# Patient Record
Sex: Female | Born: 1950 | Race: White | Hispanic: No | Marital: Married
Health system: Southern US, Community
[De-identification: ages and names within clinical notes are randomized; demographics above are authoritative.]

---

## 2000-08-21 ENCOUNTER — Encounter: Admission: RE | Admit: 2000-08-21 | Discharge: 2000-08-21 | Payer: Self-pay | Admitting: Sports Medicine

## 2000-12-17 ENCOUNTER — Encounter: Payer: Self-pay | Admitting: Emergency Medicine

## 2000-12-17 ENCOUNTER — Emergency Department (HOSPITAL_COMMUNITY): Admission: EM | Admit: 2000-12-17 | Discharge: 2000-12-17 | Payer: Self-pay | Admitting: Emergency Medicine

## 2001-01-03 ENCOUNTER — Ambulatory Visit (HOSPITAL_COMMUNITY): Admission: RE | Admit: 2001-01-03 | Discharge: 2001-01-03 | Payer: Self-pay | Admitting: Neurosurgery

## 2002-03-01 ENCOUNTER — Other Ambulatory Visit: Admission: RE | Admit: 2002-03-01 | Discharge: 2002-03-01 | Payer: Self-pay | Admitting: Family Medicine

## 2003-05-19 ENCOUNTER — Encounter: Admission: RE | Admit: 2003-05-19 | Discharge: 2003-05-19 | Payer: Self-pay | Admitting: Family Medicine

## 2003-05-29 ENCOUNTER — Encounter: Admission: RE | Admit: 2003-05-29 | Discharge: 2003-05-29 | Payer: Self-pay | Admitting: Family Medicine

## 2003-10-22 ENCOUNTER — Other Ambulatory Visit: Admission: RE | Admit: 2003-10-22 | Discharge: 2003-10-22 | Payer: Self-pay | Admitting: Family Medicine

## 2005-09-29 IMAGING — US US ABDOMEN COMPLETE
1 series · 13 of 25 positions shown · non-contrast
Comparison: none

CLINICAL DATA: Lower chest pain.  
 COMPLETE ABDOMINAL ULTRASOUND
 The gallbladder is well-visualized.  There is a tiny filling defect in the gallbladder with no shadowing and it does not move and is felt to represent a tiny gallbladder polyp.  The gallbladder otherwise appears normal.  The liver parenchyma, pancreas, and spleen are normal.  The common bile duct is normal at 2.9 mm in diameter.  The right kidney is 11.7 cm in length and has a simple 1.2 cm cyst in the mid portion.  The left kidney is 12.4 cm in length and has a simple cyst laterally in the kidney measuring 5 mm in size.  There is a complex 1.4 cm cyst with echogenic focus in the lower pole of the left kidney.  There is some shadowing from this echogenic focus suggestive of calcification.

[Series 1: unknown · 0.27mm/px · 13 of 57 slices shown]
[im 1/57]
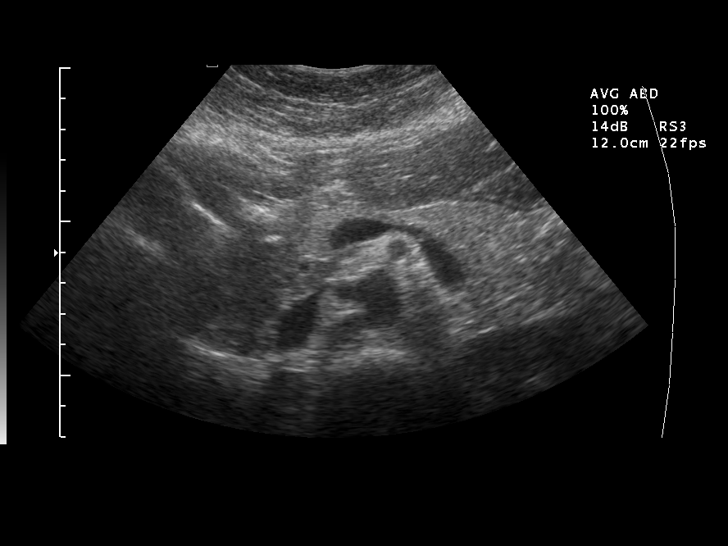
[im 5/57]
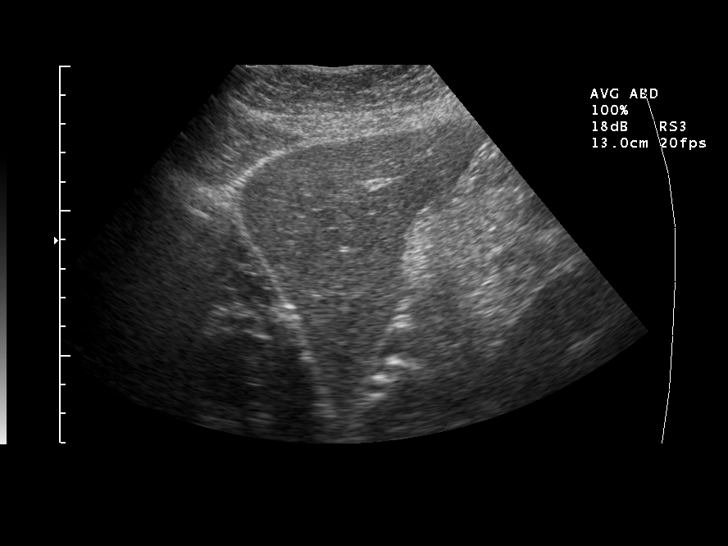
[im 10/57]
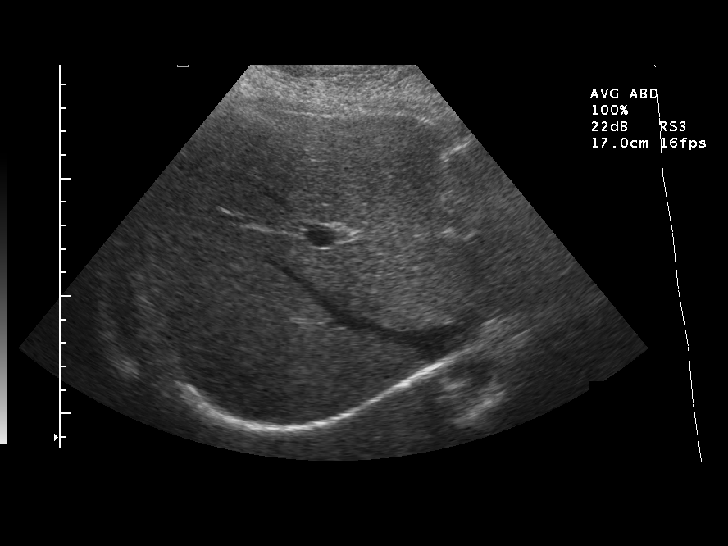
[im 15/57]
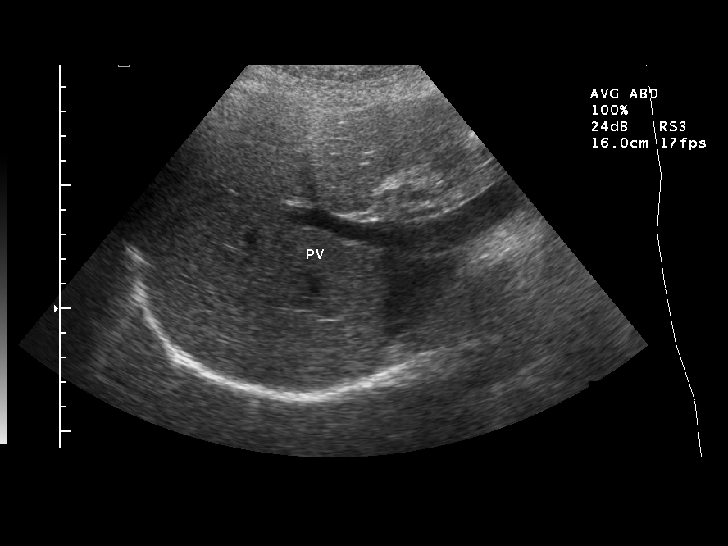
[im 19/57]
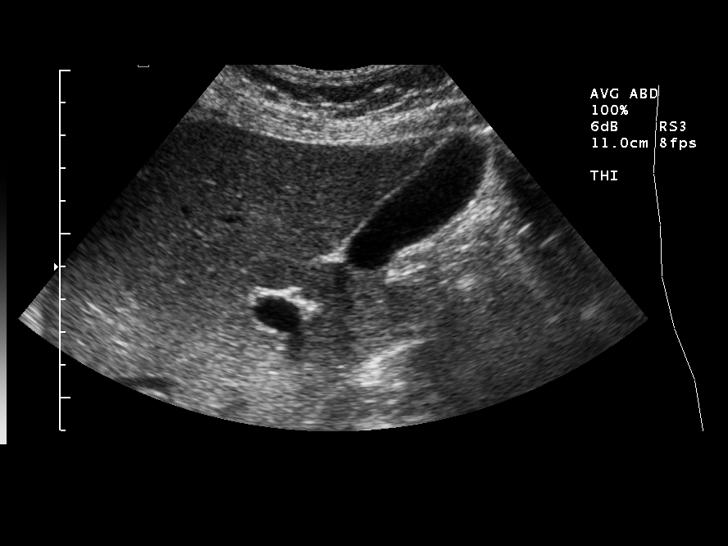
[im 24/57]
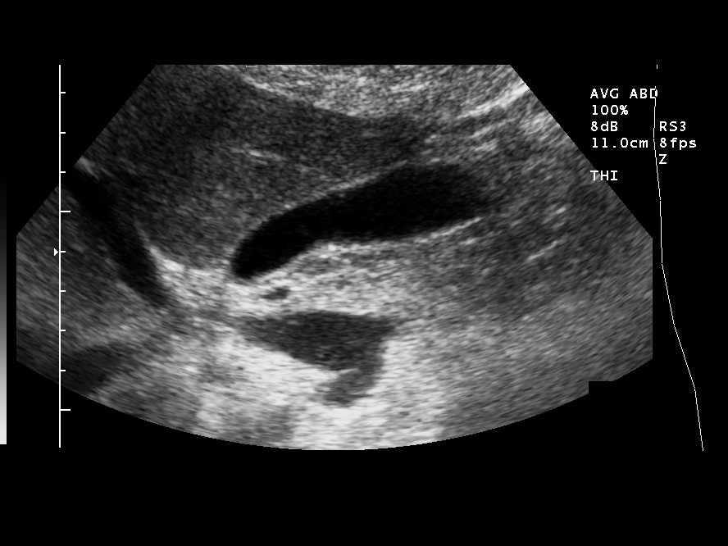
[im 29/57]
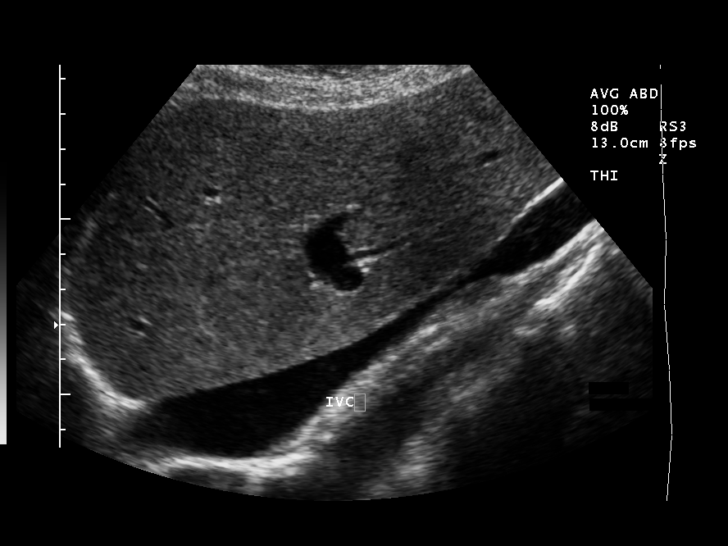
[im 33/57]
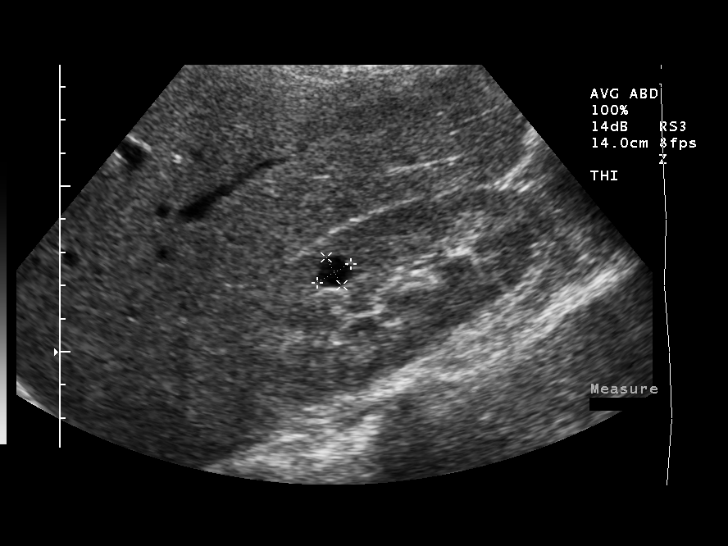
[im 38/57]
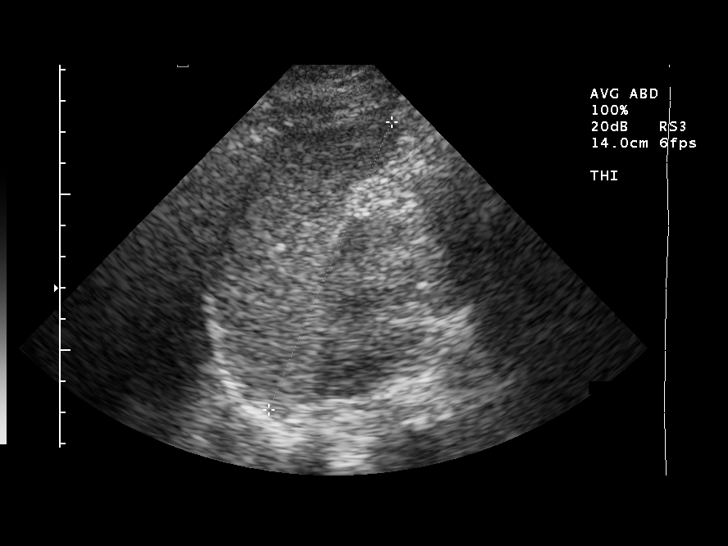
[im 43/57]
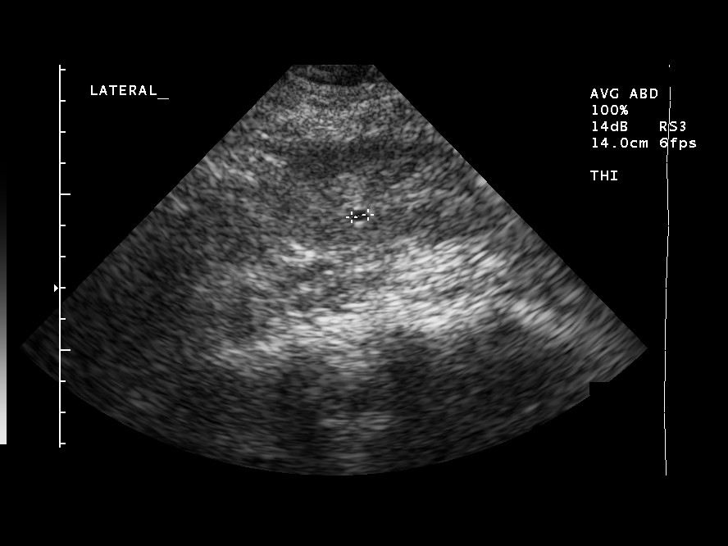
[im 47/57]
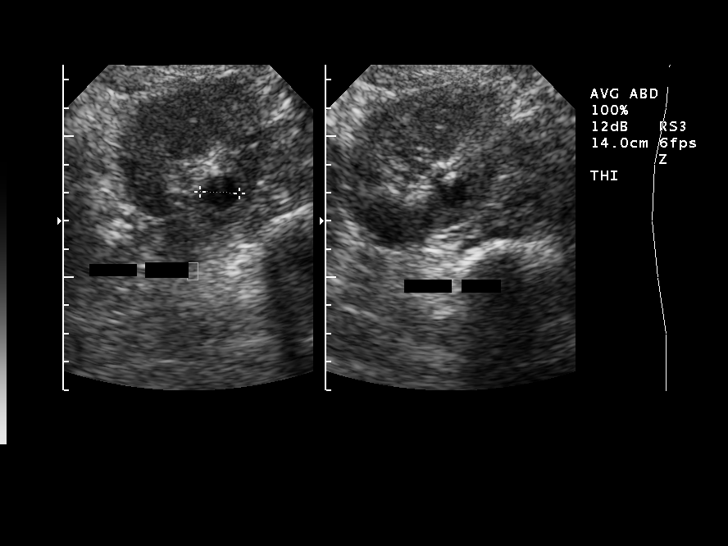
[im 52/57]
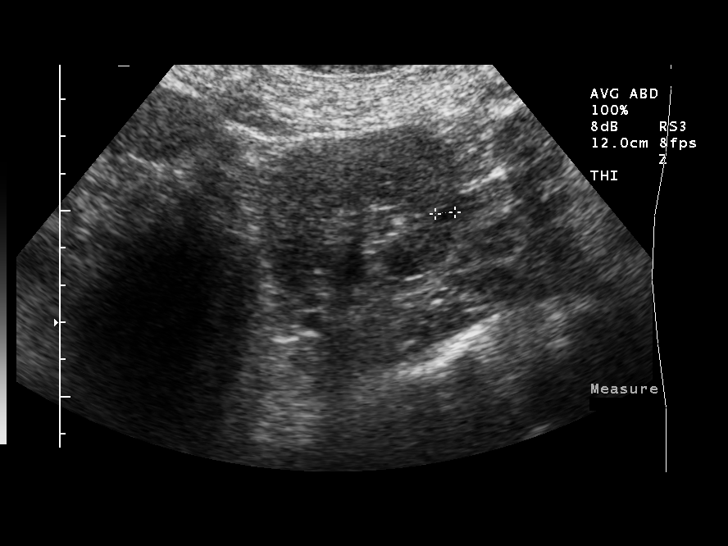
[im 57/57]
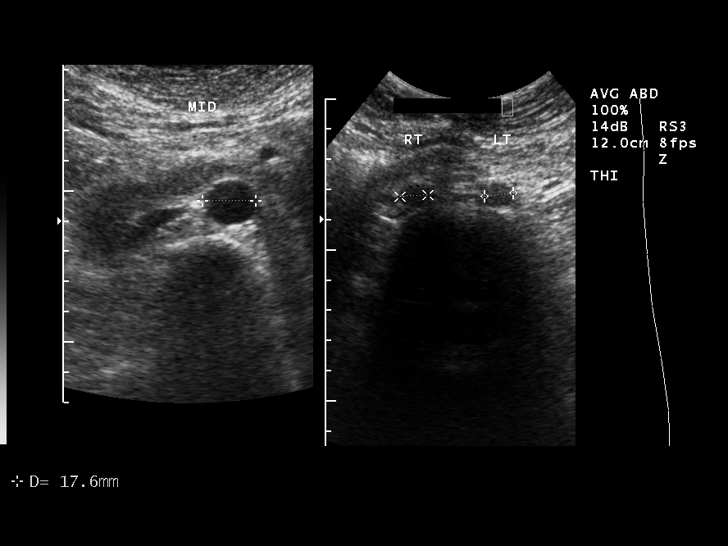

[13 of 25 positions shown; findings below may reference images not displayed]

There is no hydronephrosis or other significant abnormality.

 The abdominal aorta is normal at 2 cm.  The visualized portion of the inferior vena cava is normal. 
 IMPRESSION
 Bilateral renal cysts.  In addition, there is a complex cystic lesion in the lower pole of the left kidney with some calcification in the wall.  This could represent a hemorrhagic cyst, but I recommend further evaluation by contrast enhanced CT scan or contrast enhanced MRI scan for further characterization of this complex cystic in order to exclude a neoplasm. 

 No other significant abnormality.

## 2011-11-28 ENCOUNTER — Other Ambulatory Visit: Payer: Self-pay

## 2015-01-26 ENCOUNTER — Emergency Department (HOSPITAL_COMMUNITY)
Admission: EM | Admit: 2015-01-26 | Discharge: 2015-01-27 | Disposition: A | Discharge status: Home / Self Care | Payer: BLUE CROSS/BLUE SHIELD | Attending: Emergency Medicine | Admitting: Emergency Medicine

## 2015-01-26 DIAGNOSIS — R55 Syncope and collapse: Secondary | ICD-10-CM | POA: Diagnosis not present

## 2015-01-26 DIAGNOSIS — W1839XA Other fall on same level, initial encounter: Secondary | ICD-10-CM | POA: Diagnosis not present

## 2015-01-26 DIAGNOSIS — Y92511 Restaurant or cafe as the place of occurrence of the external cause: Secondary | ICD-10-CM | POA: Diagnosis not present

## 2015-01-26 DIAGNOSIS — R110 Nausea: Secondary | ICD-10-CM | POA: Insufficient documentation

## 2015-01-26 DIAGNOSIS — Z79899 Other long term (current) drug therapy: Secondary | ICD-10-CM | POA: Diagnosis not present

## 2015-01-26 DIAGNOSIS — S0101XA Laceration without foreign body of scalp, initial encounter: Secondary | ICD-10-CM

## 2015-01-26 DIAGNOSIS — Y999 Unspecified external cause status: Secondary | ICD-10-CM | POA: Diagnosis not present

## 2015-01-26 DIAGNOSIS — S0990XA Unspecified injury of head, initial encounter: Secondary | ICD-10-CM | POA: Diagnosis present

## 2015-01-26 DIAGNOSIS — Y9389 Activity, other specified: Secondary | ICD-10-CM | POA: Diagnosis not present

## 2015-01-26 LAB — CBC WITH DIFFERENTIAL/PLATELET
Basophils Absolute: 0.1 10*3/uL (ref 0.0–0.1)
Basophils Relative: 1 %
EOS PCT: 2 %
Eosinophils Absolute: 0.1 10*3/uL (ref 0.0–0.7)
HEMATOCRIT: 39.7 % (ref 36.0–46.0)
Hemoglobin: 13.3 g/dL (ref 12.0–15.0)
LYMPHS ABS: 2 10*3/uL (ref 0.7–4.0)
LYMPHS PCT: 21 %
MCH: 30.4 pg (ref 26.0–34.0)
MCHC: 33.5 g/dL (ref 30.0–36.0)
MCV: 90.8 fL (ref 78.0–100.0)
MONO ABS: 0.8 10*3/uL (ref 0.1–1.0)
Monocytes Relative: 9 %
NEUTROS ABS: 6.5 10*3/uL (ref 1.7–7.7)
Neutrophils Relative %: 67 %
PLATELETS: 266 10*3/uL (ref 150–400)
RBC: 4.37 MIL/uL (ref 3.87–5.11)
RDW: 12.6 % (ref 11.5–15.5)
WBC: 9.5 10*3/uL (ref 4.0–10.5)

## 2015-01-26 LAB — BASIC METABOLIC PANEL
Anion gap: 15 (ref 5–15)
BUN: 20 mg/dL (ref 6–20)
CO2: 15 mmol/L — AB (ref 22–32)
Calcium: 8.8 mg/dL — ABNORMAL LOW (ref 8.9–10.3)
Chloride: 102 mmol/L (ref 101–111)
Creatinine, Ser: 0.62 mg/dL (ref 0.44–1.00)
GFR calc Af Amer: >60 mL/min (ref >60)
GLUCOSE: 103 mg/dL — AB (ref 65–99)
POTASSIUM: 3.5 mmol/L (ref 3.5–5.1)
Sodium: 132 mmol/L — ABNORMAL LOW (ref 135–145)

## 2015-01-26 MED ORDER — MORPHINE SULFATE (PF) 2 MG/ML IV SOLN
2.0000 mg | Freq: Once | INTRAVENOUS | Status: AC
  Administered 2015-01-26: 2 mg via INTRAVENOUS
  Filled 2015-01-26: qty 1

## 2015-01-26 MED ORDER — SODIUM CHLORIDE 0.9 % IV BOLUS (SEPSIS)
1000.0000 mL | Freq: Once | INTRAVENOUS | Status: AC
  Administered 2015-01-26: 1000 mL via INTRAVENOUS

## 2015-01-26 MED ORDER — ONDANSETRON HCL 4 MG/2ML IJ SOLN
4.0000 mg | Freq: Once | INTRAMUSCULAR | Status: AC
  Administered 2015-01-26: 4 mg via INTRAVENOUS
  Filled 2015-01-26: qty 2

## 2015-01-26 NOTE — ED Notes (Signed)
Per EMS: Pt drinking at wine bar. Stood up to go to rest room, experienced a syncopal episode, fell and struck head on chair. Pt remembers standing up, does not remember falling. BP with EMS = 80/64. EMS gave 500ml NS bolus. Pt complaining of nausea, EMS gave 4mg  zofran IV. Pt has lac to back R head.

## 2015-01-27 ENCOUNTER — Emergency Department (HOSPITAL_COMMUNITY): Payer: BLUE CROSS/BLUE SHIELD

## 2015-01-27 NOTE — ED Provider Notes (Signed)
CSN: [id number]     Arrival date & time 01/26/15  2116 History   First MD Initiated Contact with Patient 01/26/15 2127     Chief Complaint  Patient presents with  . Loss of Consciousness     (Consider location/radiation/quality/duration/timing/severity/associated sxs/prior Treatment) HPI.... Patient was drinking wine at a wine bar. She stood up and passed out striking the back of her head on a chair. No neurological deficits. Blood pressure initially 80/64. Complains of nausea. No previous episodes of syncope.  She is alert and oriented.  No past medical history on file. No past surgical history on file. No family history on file. Social History  Substance Use Topics  . Smoking status: Not on file  . Smokeless tobacco: Not on file  . Alcohol Use: Not on file   OB History    No data available     Review of Systems  All other systems reviewed and are negative.     Allergies  Review of patient's allergies indicates no known allergies.  Home Medications   Prior to Admission medications   Medication Sig Start Date End Date Taking? Authorizing Provider  Cholecalciferol (VITAMIN D3) 5000 UNITS CAPS Take 1 capsule by mouth daily.   Yes Historical Provider, MD  Diclofenac-Misoprostol (ARTHROTEC PO) Take 1 tablet by mouth daily. Patient states its . Per patient   Yes Historical Provider, MD  sertraline (ZOLOFT) 50 MG tablet Take 50 mg by mouth daily.   Yes Historical Provider, MD   BP 111/66 mmHg  Pulse 64  Resp 14  SpO2 94% Physical Exam  Constitutional: She is oriented to person, place, and time. She appears well-developed and well-nourished.  HENT:  Head: Normocephalic.  2.5 cm occipital laceration  Eyes: Conjunctivae and EOM are normal. Pupils are equal, round, and reactive to light.  Neck: Normal range of motion. Neck supple.  Cardiovascular: Normal rate and regular rhythm.   Pulmonary/Chest: Effort normal and breath sounds normal.  Abdominal: Soft. Bowel sounds  are normal.  Musculoskeletal: Normal range of motion.  Neurological: She is alert and oriented to person, place, and time.  Skin: Skin is warm and dry.  Psychiatric: She has a normal mood and affect. Her behavior is normal.  Nursing note and vitals reviewed.   ED Course  .Marland KitchenLaceration Repair Date/Time: 01/27/2015 1:24 AM Performed by: Donnetta Hutching Authorized by: Donnetta Hutching Comments: 0100:  2.5 cm occipital laceration. Here edges trimmed. Wound cleaned with normal saline. Staples 4. Patient tolerated procedure well.   (including critical care time) Labs Review Labs Reviewed  BASIC METABOLIC PANEL - Abnormal; Notable for the following:    Sodium 132 (*)    CO2 15 (*)    Glucose, Bld 103 (*)    Calcium 8.8 (*)    All other components within normal limits  CBC WITH DIFFERENTIAL/PLATELET    Imaging Review Ct Head Wo Contrast  01/27/2015  CLINICAL DATA:  Acute onset of lightheadedness. Status post fall, hitting occipital region and forehead. Laceration at the occiput. Initial encounter. EXAM: CT HEAD WITHOUT CONTRAST TECHNIQUE: Contiguous axial images were obtained from the base of the skull through the vertex without intravenous contrast. COMPARISON:  None. FINDINGS: There is no evidence of acute infarction, mass lesion, or intra- or extra-axial hemorrhage on CT. Mild periventricular white matter change likely reflects small vessel ischemic microangiopathy. The posterior fossa, including the cerebellum, brainstem and fourth ventricle, is within normal limits. The third and lateral ventricles, and basal ganglia are unremarkable in appearance. The cerebral  hemispheres are symmetric in appearance, with normal gray-white differentiation. No mass effect or midline shift is seen. There is no evidence of fracture; visualized osseous structures are unremarkable in appearance. The orbits are within normal limits. Mucosal thickening is noted at the maxillary sinuses bilaterally. The remaining  paranasal sinuses and mastoid air cells are well-aerated. No significant soft tissue abnormalities are seen. IMPRESSION: 1. No acute intracranial pathology seen on CT. 2. Mild small vessel ischemic microangiopathy. 3. Mucosal thickening at the maxillary sinuses bilaterally. Electronically Signed   By: Roanna RaiderJeffery  Chang M.D.   On: 01/27/2015 01:18   I have personally reviewed and evaluated these images and lab results as part of my medical decision-making.   EKG Interpretation   Date/Time:  Monday January 26 2015 21:17:53 EDT Ventricular Rate:  70 PR Interval:  137 QRS Duration: 79 QT Interval:  435 QTC Calculation: 469 R Axis:   45 Text Interpretation:  Sinus rhythm Probable anteroseptal infarct, old  Confirmed by Coral Soler  MD, Marry Kusch ([id number]) on 01/26/2015 10:03:26 PM      MDM   Final diagnoses:  Syncope, unspecified syncope type  Scalp laceration, initial encounter   Suspect vasovagal hypotension secondary to alcohol consumption and rapid standing.  Blood pressure has normalized here. She is neurologically intact. CT head shows no acute pathology.    2.5 cm scalp laceration as above.    Donnetta HutchingBrian Montrell Cessna, MD 01/27/15 2241

## 2015-01-27 NOTE — Discharge Instructions (Signed)
Can shower tonight.  Staples out next Tuesday.  Follow up with your primary care dr.   [place] were good

## 2015-06-22 DIAGNOSIS — R7301 Impaired fasting glucose: Secondary | ICD-10-CM | POA: Diagnosis not present

## 2015-06-22 DIAGNOSIS — R8290 Unspecified abnormal findings in urine: Secondary | ICD-10-CM | POA: Diagnosis not present

## 2015-06-22 DIAGNOSIS — Z0000 Encounter for general adult medical examination without abnormal findings: Secondary | ICD-10-CM | POA: Diagnosis not present

## 2015-06-22 DIAGNOSIS — M859 Disorder of bone density and structure, unspecified: Secondary | ICD-10-CM | POA: Diagnosis not present

## 2015-06-22 DIAGNOSIS — E668 Other obesity: Secondary | ICD-10-CM | POA: Diagnosis not present

## 2015-06-29 DIAGNOSIS — Z23 Encounter for immunization: Secondary | ICD-10-CM | POA: Diagnosis not present

## 2015-06-29 DIAGNOSIS — R7301 Impaired fasting glucose: Secondary | ICD-10-CM | POA: Diagnosis not present

## 2015-06-29 DIAGNOSIS — E669 Obesity, unspecified: Secondary | ICD-10-CM | POA: Diagnosis not present

## 2015-06-29 DIAGNOSIS — E784 Other hyperlipidemia: Secondary | ICD-10-CM | POA: Diagnosis not present

## 2015-06-29 DIAGNOSIS — R312 Other microscopic hematuria: Secondary | ICD-10-CM | POA: Diagnosis not present

## 2015-06-29 DIAGNOSIS — Z6831 Body mass index (BMI) 31.0-31.9, adult: Secondary | ICD-10-CM | POA: Diagnosis not present

## 2015-06-29 DIAGNOSIS — F325 Major depressive disorder, single episode, in full remission: Secondary | ICD-10-CM | POA: Diagnosis not present

## 2015-06-29 DIAGNOSIS — R030 Elevated blood-pressure reading, without diagnosis of hypertension: Secondary | ICD-10-CM | POA: Diagnosis not present

## 2015-06-29 DIAGNOSIS — Z0000 Encounter for general adult medical examination without abnormal findings: Secondary | ICD-10-CM | POA: Diagnosis not present

## 2015-06-29 DIAGNOSIS — Z1389 Encounter for screening for other disorder: Secondary | ICD-10-CM | POA: Diagnosis not present

## 2015-06-29 DIAGNOSIS — M25562 Pain in left knee: Secondary | ICD-10-CM | POA: Diagnosis not present

## 2015-06-29 DIAGNOSIS — M859 Disorder of bone density and structure, unspecified: Secondary | ICD-10-CM | POA: Diagnosis not present

## 2015-06-29 DIAGNOSIS — E668 Other obesity: Secondary | ICD-10-CM | POA: Diagnosis not present

## 2015-07-29 DIAGNOSIS — Z0100 Encounter for examination of eyes and vision without abnormal findings: Secondary | ICD-10-CM | POA: Diagnosis not present

## 2015-07-29 DIAGNOSIS — H2513 Age-related nuclear cataract, bilateral: Secondary | ICD-10-CM | POA: Diagnosis not present

## 2015-11-06 DIAGNOSIS — L237 Allergic contact dermatitis due to plants, except food: Secondary | ICD-10-CM | POA: Diagnosis not present

## 2016-01-09 DIAGNOSIS — Z23 Encounter for immunization: Secondary | ICD-10-CM | POA: Diagnosis not present

## 2016-04-13 DIAGNOSIS — Z803 Family history of malignant neoplasm of breast: Secondary | ICD-10-CM | POA: Diagnosis not present

## 2016-04-13 DIAGNOSIS — Z1231 Encounter for screening mammogram for malignant neoplasm of breast: Secondary | ICD-10-CM | POA: Diagnosis not present

## 2016-04-15 DIAGNOSIS — R921 Mammographic calcification found on diagnostic imaging of breast: Secondary | ICD-10-CM | POA: Diagnosis not present

## 2016-06-29 DIAGNOSIS — R7301 Impaired fasting glucose: Secondary | ICD-10-CM | POA: Diagnosis not present

## 2016-06-29 DIAGNOSIS — E784 Other hyperlipidemia: Secondary | ICD-10-CM | POA: Diagnosis not present

## 2016-06-29 DIAGNOSIS — M859 Disorder of bone density and structure, unspecified: Secondary | ICD-10-CM | POA: Diagnosis not present

## 2016-07-06 DIAGNOSIS — Z0000 Encounter for general adult medical examination without abnormal findings: Secondary | ICD-10-CM | POA: Diagnosis not present

## 2016-07-06 DIAGNOSIS — Z6832 Body mass index (BMI) 32.0-32.9, adult: Secondary | ICD-10-CM | POA: Diagnosis not present

## 2016-07-06 DIAGNOSIS — R030 Elevated blood-pressure reading, without diagnosis of hypertension: Secondary | ICD-10-CM | POA: Diagnosis not present

## 2016-07-06 DIAGNOSIS — K5289 Other specified noninfective gastroenteritis and colitis: Secondary | ICD-10-CM | POA: Diagnosis not present

## 2016-07-06 DIAGNOSIS — F325 Major depressive disorder, single episode, in full remission: Secondary | ICD-10-CM | POA: Diagnosis not present

## 2016-07-06 DIAGNOSIS — R3129 Other microscopic hematuria: Secondary | ICD-10-CM | POA: Diagnosis not present

## 2016-07-06 DIAGNOSIS — E668 Other obesity: Secondary | ICD-10-CM | POA: Diagnosis not present

## 2016-07-06 DIAGNOSIS — M859 Disorder of bone density and structure, unspecified: Secondary | ICD-10-CM | POA: Diagnosis not present

## 2016-07-06 DIAGNOSIS — R7301 Impaired fasting glucose: Secondary | ICD-10-CM | POA: Diagnosis not present

## 2016-07-06 DIAGNOSIS — Z23 Encounter for immunization: Secondary | ICD-10-CM | POA: Diagnosis not present

## 2016-07-06 DIAGNOSIS — E784 Other hyperlipidemia: Secondary | ICD-10-CM | POA: Diagnosis not present

## 2016-08-03 DIAGNOSIS — H2513 Age-related nuclear cataract, bilateral: Secondary | ICD-10-CM | POA: Diagnosis not present

## 2016-08-03 DIAGNOSIS — H5213 Myopia, bilateral: Secondary | ICD-10-CM | POA: Diagnosis not present

## 2016-10-20 DIAGNOSIS — R921 Mammographic calcification found on diagnostic imaging of breast: Secondary | ICD-10-CM | POA: Diagnosis not present

## 2017-02-04 DIAGNOSIS — Z23 Encounter for immunization: Secondary | ICD-10-CM | POA: Diagnosis not present

## 2017-03-17 DIAGNOSIS — R05 Cough: Secondary | ICD-10-CM | POA: Diagnosis not present

## 2017-03-17 DIAGNOSIS — J209 Acute bronchitis, unspecified: Secondary | ICD-10-CM | POA: Diagnosis not present

## 2017-03-17 DIAGNOSIS — Z6834 Body mass index (BMI) 34.0-34.9, adult: Secondary | ICD-10-CM | POA: Diagnosis not present

## 2017-03-30 DIAGNOSIS — J209 Acute bronchitis, unspecified: Secondary | ICD-10-CM | POA: Diagnosis not present

## 2017-03-30 DIAGNOSIS — Z6833 Body mass index (BMI) 33.0-33.9, adult: Secondary | ICD-10-CM | POA: Diagnosis not present

## 2017-03-30 DIAGNOSIS — J309 Allergic rhinitis, unspecified: Secondary | ICD-10-CM | POA: Diagnosis not present

## 2017-03-30 DIAGNOSIS — R05 Cough: Secondary | ICD-10-CM | POA: Diagnosis not present

## 2017-04-27 DIAGNOSIS — R921 Mammographic calcification found on diagnostic imaging of breast: Secondary | ICD-10-CM | POA: Diagnosis not present

## 2017-04-27 DIAGNOSIS — Z803 Family history of malignant neoplasm of breast: Secondary | ICD-10-CM | POA: Diagnosis not present

## 2017-06-09 IMAGING — CT CT HEAD W/O CM
2 series · 16 of 30 positions shown, 18 images · non-contrast
Comparison: None.

CLINICAL DATA: Acute onset of lightheadedness. Status post fall,
hitting occipital region and forehead. Laceration at the occiput.
Initial encounter.

EXAM:
CT HEAD WITHOUT CONTRAST
TECHNIQUE: Contiguous axial images were obtained from the base of the skull
through the vertex without intravenous contrast.

[Series 201: head w/o, idose (1) · axial · non-contrast · 0.49mm/px · z∈[+48,+158]mm · 8 of 30 slices shown, 10 images]
[im 4/30  brain]
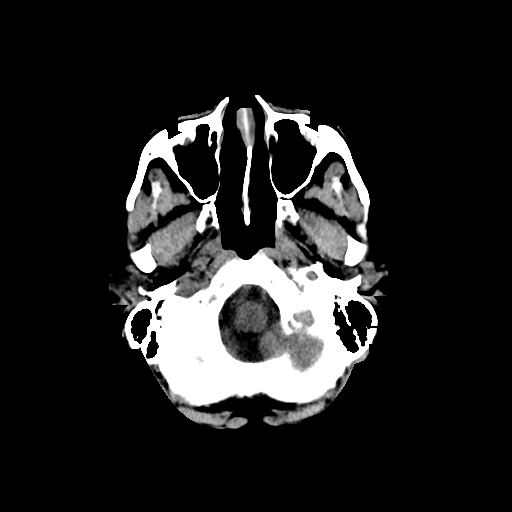
[im 4/30  bone]
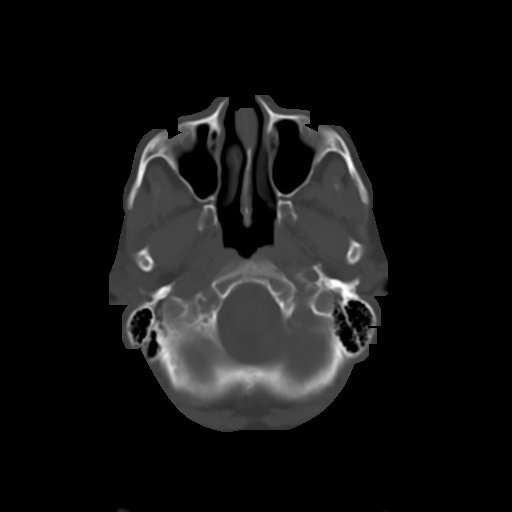
[im 7/30  brain]
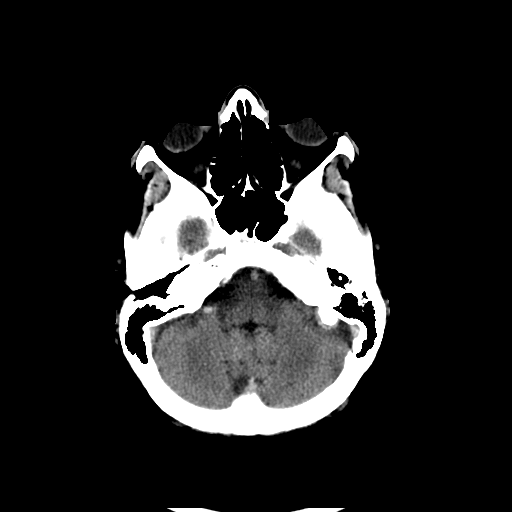
[im 10/30  brain]
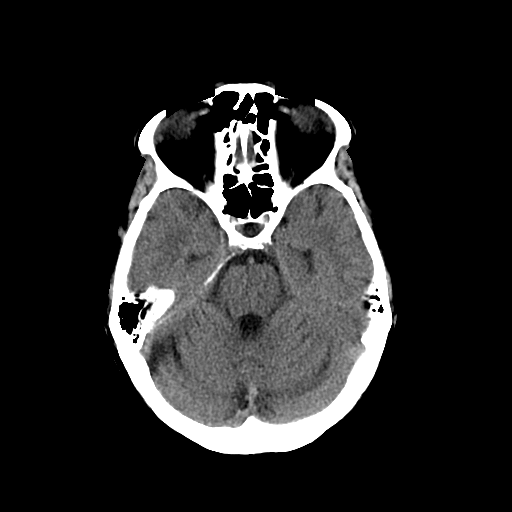
[im 13/30  brain]
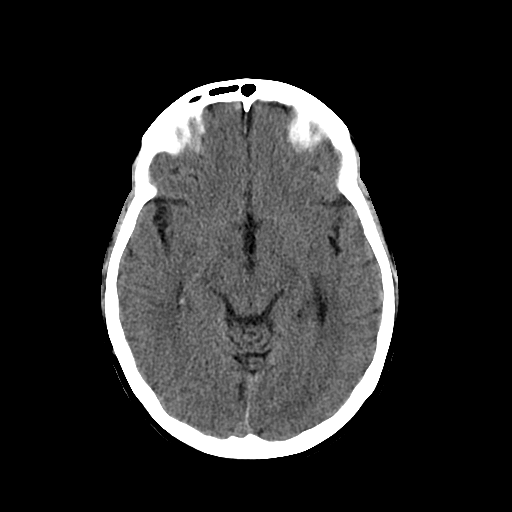
[im 17/30  brain]
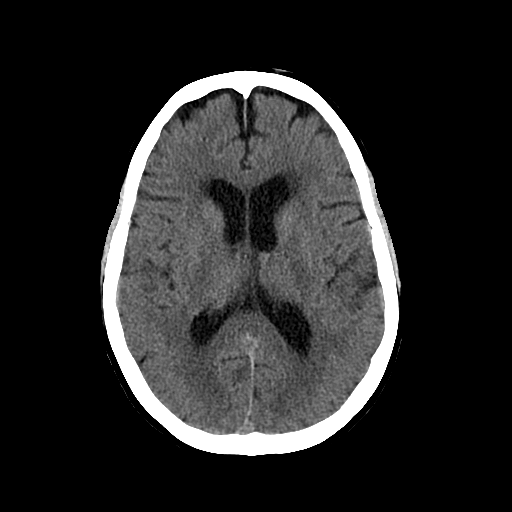
[im 17/30  bone]
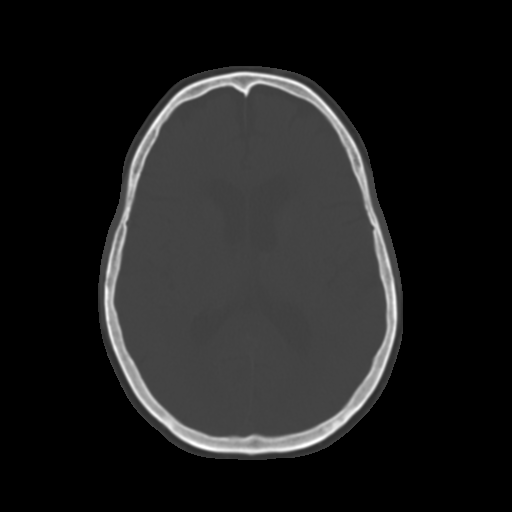
[im 20/30  brain]
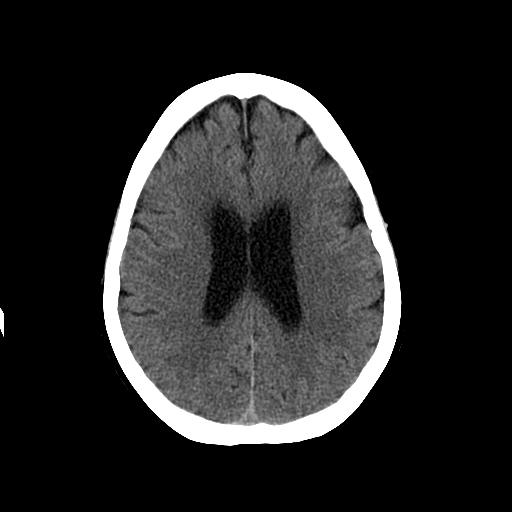
[im 23/30  brain]
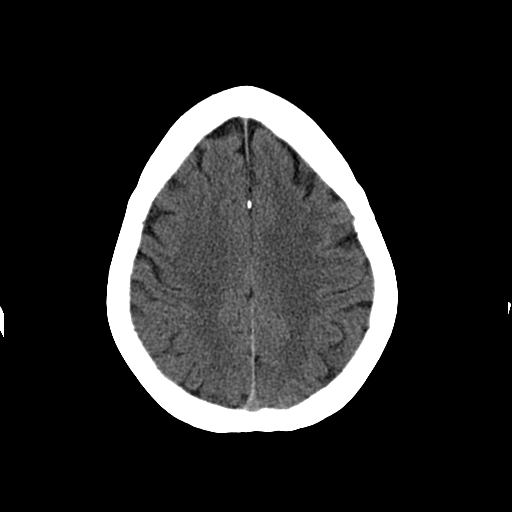
[im 26/30  brain]
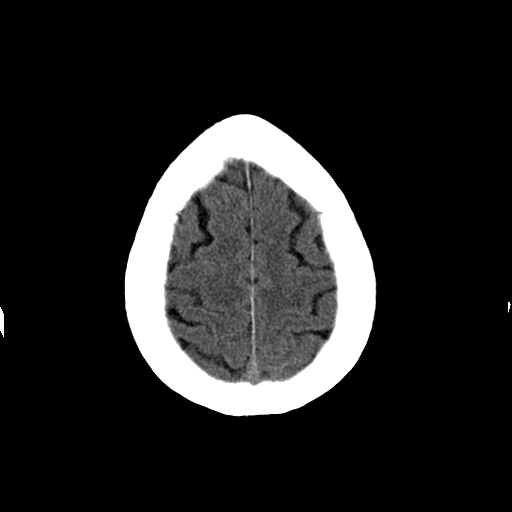

[Series 202: head w/o bone, idose (1) · axial · non-contrast · 0.49mm/px · z∈[+47,+162]mm · 8 of 60 slices shown]
[im 7/60  bone]
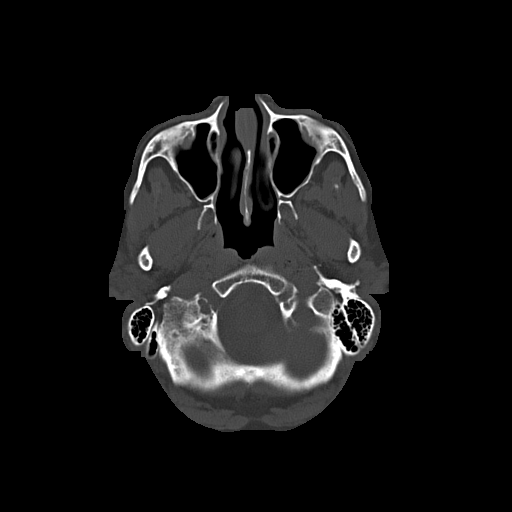
[im 13/60  bone]
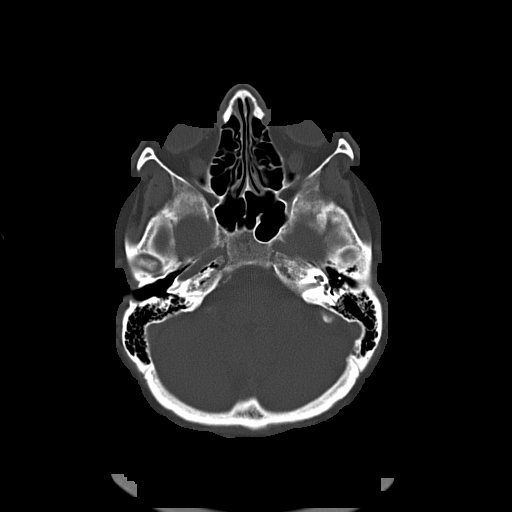
[im 19/60  bone]
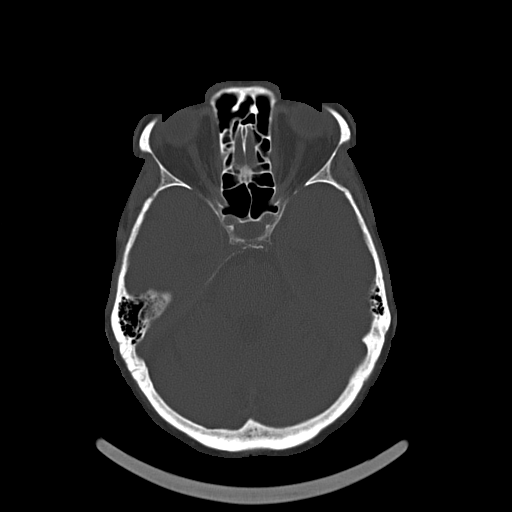
[im 25/60  bone]
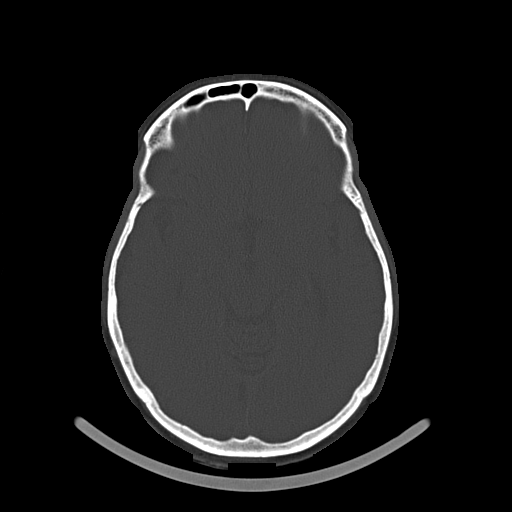
[im 35/60  bone]
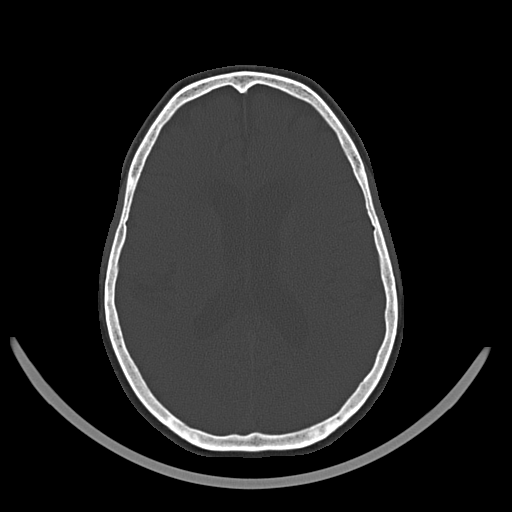
[im 41/60  bone]
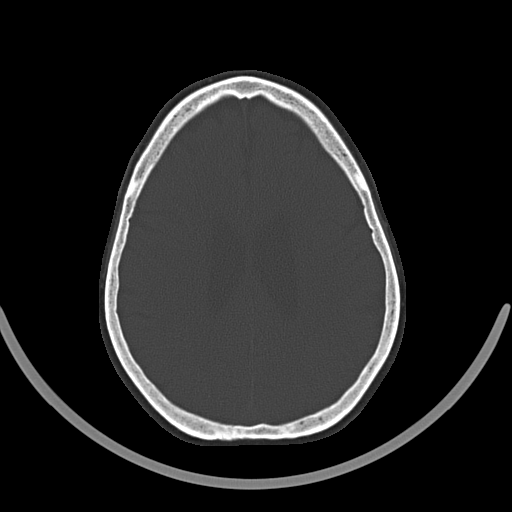
[im 47/60  bone]
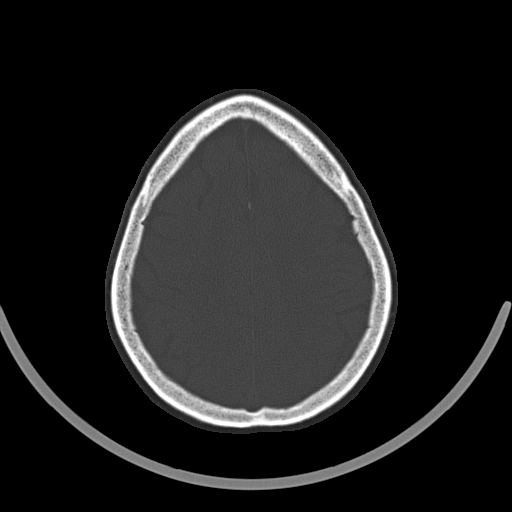
[im 53/60  bone]
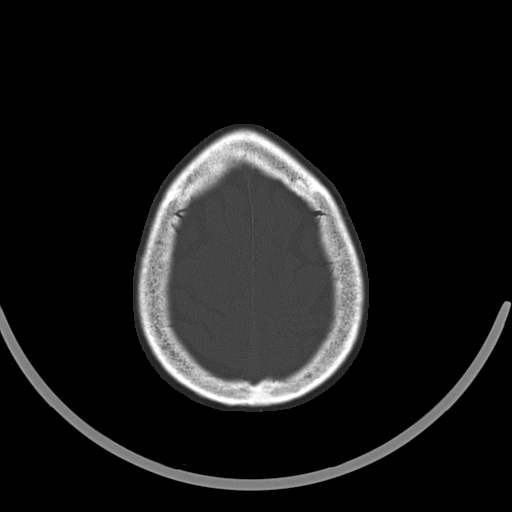

[16 of 30 positions shown; findings below may reference images not displayed]

FINDINGS: There is no evidence of acute infarction, mass lesion, or intra- or
extra-axial hemorrhage on CT.

Mild periventricular white matter change likely reflects small
vessel ischemic microangiopathy.

The posterior fossa, including the cerebellum, brainstem and fourth
ventricle, is within normal limits. The third and lateral
ventricles, and basal ganglia are unremarkable in appearance. The
cerebral hemispheres are symmetric in appearance, with normal
gray-white differentiation. No mass effect or midline shift is seen.

There is no evidence of fracture; visualized osseous structures are
unremarkable in appearance. The orbits are within normal limits.
Mucosal thickening is noted at the maxillary sinuses bilaterally.
The remaining paranasal sinuses and mastoid air cells are
well-aerated. No significant soft tissue abnormalities are seen.
IMPRESSION: 1. No acute intracranial pathology seen on CT.
2. Mild small vessel ischemic microangiopathy.
3. Mucosal thickening at the maxillary sinuses bilaterally.

## 2017-08-02 DIAGNOSIS — R7301 Impaired fasting glucose: Secondary | ICD-10-CM | POA: Diagnosis not present

## 2017-08-02 DIAGNOSIS — E7849 Other hyperlipidemia: Secondary | ICD-10-CM | POA: Diagnosis not present

## 2017-08-02 DIAGNOSIS — M859 Disorder of bone density and structure, unspecified: Secondary | ICD-10-CM | POA: Diagnosis not present

## 2017-08-02 DIAGNOSIS — R82998 Other abnormal findings in urine: Secondary | ICD-10-CM | POA: Diagnosis not present

## 2017-08-10 DIAGNOSIS — Z6832 Body mass index (BMI) 32.0-32.9, adult: Secondary | ICD-10-CM | POA: Diagnosis not present

## 2017-08-10 DIAGNOSIS — E7849 Other hyperlipidemia: Secondary | ICD-10-CM | POA: Diagnosis not present

## 2017-08-10 DIAGNOSIS — M25511 Pain in right shoulder: Secondary | ICD-10-CM | POA: Diagnosis not present

## 2017-08-10 DIAGNOSIS — Z0000 Encounter for general adult medical examination without abnormal findings: Secondary | ICD-10-CM | POA: Diagnosis not present

## 2017-08-10 DIAGNOSIS — M25561 Pain in right knee: Secondary | ICD-10-CM | POA: Diagnosis not present

## 2017-08-10 DIAGNOSIS — R7301 Impaired fasting glucose: Secondary | ICD-10-CM | POA: Diagnosis not present

## 2017-08-10 DIAGNOSIS — M859 Disorder of bone density and structure, unspecified: Secondary | ICD-10-CM | POA: Diagnosis not present

## 2017-08-10 DIAGNOSIS — F3341 Major depressive disorder, recurrent, in partial remission: Secondary | ICD-10-CM | POA: Diagnosis not present

## 2017-08-10 DIAGNOSIS — R030 Elevated blood-pressure reading, without diagnosis of hypertension: Secondary | ICD-10-CM | POA: Diagnosis not present

## 2017-08-10 DIAGNOSIS — K529 Noninfective gastroenteritis and colitis, unspecified: Secondary | ICD-10-CM | POA: Diagnosis not present

## 2017-08-10 DIAGNOSIS — Z1389 Encounter for screening for other disorder: Secondary | ICD-10-CM | POA: Diagnosis not present

## 2017-08-10 DIAGNOSIS — G5601 Carpal tunnel syndrome, right upper limb: Secondary | ICD-10-CM | POA: Diagnosis not present

## 2018-01-13 DIAGNOSIS — Z23 Encounter for immunization: Secondary | ICD-10-CM | POA: Diagnosis not present
